# Patient Record
Sex: Female | Born: 1990 | Hispanic: Yes | Marital: Single | Smoking: Never smoker
Health system: Southern US, Community
[De-identification: ages and names within clinical notes are randomized; demographics above are authoritative.]

## PROBLEM LIST (undated history)

## (undated) DIAGNOSIS — J45909 Unspecified asthma, uncomplicated: Secondary | ICD-10-CM

## (undated) DIAGNOSIS — B373 Candidiasis of vulva and vagina: Secondary | ICD-10-CM

---

## 2017-11-05 ENCOUNTER — Emergency Department (HOSPITAL_COMMUNITY): Payer: No Typology Code available for payment source

## 2017-11-05 ENCOUNTER — Emergency Department (HOSPITAL_COMMUNITY)
Admission: EM | Admit: 2017-11-05 | Discharge: 2017-11-05 | Disposition: A | Discharge status: Home / Self Care | Payer: No Typology Code available for payment source | Attending: Emergency Medicine | Admitting: Emergency Medicine

## 2017-11-05 ENCOUNTER — Other Ambulatory Visit: Payer: Self-pay

## 2017-11-05 ENCOUNTER — Encounter (HOSPITAL_COMMUNITY): Payer: Self-pay

## 2017-11-05 DIAGNOSIS — Y92410 Unspecified street and highway as the place of occurrence of the external cause: Secondary | ICD-10-CM | POA: Insufficient documentation

## 2017-11-05 DIAGNOSIS — Y998 Other external cause status: Secondary | ICD-10-CM | POA: Insufficient documentation

## 2017-11-05 DIAGNOSIS — Y939 Activity, unspecified: Secondary | ICD-10-CM | POA: Diagnosis not present

## 2017-11-05 DIAGNOSIS — S0990XA Unspecified injury of head, initial encounter: Secondary | ICD-10-CM | POA: Insufficient documentation

## 2017-11-05 DIAGNOSIS — B373 Candidiasis of vulva and vagina: Secondary | ICD-10-CM

## 2017-11-05 DIAGNOSIS — B3731 Acute candidiasis of vulva and vagina: Secondary | ICD-10-CM

## 2017-11-05 DIAGNOSIS — J45909 Unspecified asthma, uncomplicated: Secondary | ICD-10-CM | POA: Insufficient documentation

## 2017-11-05 HISTORY — DX: Unspecified asthma, uncomplicated: J45.909

## 2017-11-05 HISTORY — DX: Acute candidiasis of vulva and vagina: B37.31

## 2017-11-05 HISTORY — DX: Candidiasis of vulva and vagina: B37.3

## 2017-11-05 MED ORDER — IBUPROFEN 800 MG PO TABS: 800.0000 mg | ORAL_TABLET | Freq: Three times a day (TID) | ORAL | 0 refills | Status: AC | PRN
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
## 2017-11-05 NOTE — ED Notes (Signed)
Per xray, pt got very nauseated when standing
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
## 2017-11-05 NOTE — Discharge Instructions (Addendum)
Follow up if not improving
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
